# Patient Record
Sex: Female | Born: 2000 | Race: Black or African American | Hispanic: No | Marital: Single | Smoking: Current every day smoker
Health system: Southern US, Community
[De-identification: ages and names within clinical notes are randomized; demographics above are authoritative.]

## PROBLEM LIST (undated history)

## (undated) ENCOUNTER — Inpatient Hospital Stay: Payer: Self-pay

## (undated) DIAGNOSIS — F909 Attention-deficit hyperactivity disorder, unspecified type: Secondary | ICD-10-CM

## (undated) DIAGNOSIS — Z789 Other specified health status: Secondary | ICD-10-CM

## (undated) DIAGNOSIS — F913 Oppositional defiant disorder: Secondary | ICD-10-CM

---
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
## 2021-05-24 ENCOUNTER — Ambulatory Visit (LOCAL_COMMUNITY_HEALTH_CENTER): Payer: Medicaid Other | Admitting: Advanced Practice Midwife

## 2021-05-24 ENCOUNTER — Other Ambulatory Visit: Payer: Self-pay

## 2021-05-24 DIAGNOSIS — Z3009 Encounter for other general counseling and advice on contraception: Secondary | ICD-10-CM | POA: Diagnosis not present

## 2021-05-24 DIAGNOSIS — Z5321 Procedure and treatment not carried out due to patient leaving prior to being seen by health care provider: Secondary | ICD-10-CM

## 2021-05-24 NOTE — Progress Notes (Signed)
Client called to clinic and counseled would be approximately 30 - 40 minutes before provider exam and RN would review her history first. Client became tearful and asked to reschedule appt. Then asked if could just get STD check done. Counseled that provider would need to do STD exam. Client was friendly, but became more upset and tearful and called mother to come pick her up. Appt rescheduled for arrival time of 0800 on [name] schedule and aware will be first client seen. RN kept her completed hx and client aware will not need to repeat tomorrow am. Papers / education bag on [facility] nurse to do cart. Per client, had Nexplanon removed 6 months ago by [facility] and has been having unprotected sex since without pregnancy and wants to get that checked out. Counseled that [facility] did not do fertility evaluations and client understands. States has not had a physical in over 1 year so keeping am appt. [name], RN

## 2021-05-25 ENCOUNTER — Ambulatory Visit: Payer: Medicaid Other

## 2021-08-08 ENCOUNTER — Other Ambulatory Visit: Payer: Self-pay

## 2021-08-08 ENCOUNTER — Encounter: Payer: Self-pay | Admitting: Emergency Medicine

## 2021-08-08 ENCOUNTER — Emergency Department
Admission: EM | Admit: 2021-08-08 | Discharge: 2021-08-08 | Disposition: A | Discharge status: Home / Self Care | Payer: Medicaid Other | Attending: Emergency Medicine | Admitting: Emergency Medicine

## 2021-08-08 DIAGNOSIS — M545 Low back pain, unspecified: Secondary | ICD-10-CM | POA: Diagnosis not present

## 2021-08-08 DIAGNOSIS — M25551 Pain in right hip: Secondary | ICD-10-CM | POA: Insufficient documentation

## 2021-08-08 DIAGNOSIS — Z5321 Procedure and treatment not carried out due to patient leaving prior to being seen by health care provider: Secondary | ICD-10-CM | POA: Insufficient documentation

## 2021-08-08 NOTE — ED Notes (Signed)
Pt called in the [facility] with no response 

## 2021-08-08 NOTE — ED Notes (Signed)
Called x 2 no answer
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
## 2021-08-08 NOTE — ED Notes (Signed)
Called x 1 no answer

## 2021-10-19 ENCOUNTER — Ambulatory Visit: Payer: Medicaid Other | Admitting: Family Medicine

## 2021-10-19 ENCOUNTER — Other Ambulatory Visit: Payer: Self-pay

## 2021-10-19 ENCOUNTER — Encounter: Payer: Self-pay | Admitting: Family Medicine

## 2021-10-19 DIAGNOSIS — Z113 Encounter for screening for infections with a predominantly sexual mode of transmission: Secondary | ICD-10-CM | POA: Diagnosis not present

## 2021-10-19 DIAGNOSIS — Z202 Contact with and (suspected) exposure to infections with a predominantly sexual mode of transmission: Secondary | ICD-10-CM

## 2021-10-19 LAB — WET PREP FOR TRICH, YEAST, CLUE
Trichomonas Exam: NEGATIVE
Yeast Exam: NEGATIVE

## 2021-10-19 MED ORDER — DOXYCYCLINE HYCLATE 100 MG PO TABS: 100.0000 mg | ORAL_TABLET | Freq: Two times a day (BID) | ORAL | 0 refills | Status: AC

## 2021-10-19 NOTE — Progress Notes (Incomplete)
[facility] Department  STI clinic/screening visit [address] [place] [address] [phone]  Subjective:  Shelley Wagner is a 21 y.o. female being seen today for an STI screening visit. The patient reports they {Actions; do/do not:19616} have symptoms.  Patient reports that they {Actions; do/do not:19616} desire a pregnancy in the next year.   They reported they {Actions; are/are not:16769} interested in discussing contraception today.    Patient's last menstrual period was 10/06/2021 (approximate).   Patient has the following medical conditions:   Patient Active Problem List   Diagnosis Date Noted   Tubo-ovarian abscess 02/06/2018    Chief Complaint  Patient presents with   SEXUALLY TRANSMITTED DISEASE    screening    HPI  Patient reports  she is contact to NGU.  She states that only symptom noted is thickened white vaginal discharge with an odor.  Last HIV test per patient/review of record was 11/11/2020 Patient reports last pap was na.  Screening for MPX risk: Does the patient have an unexplained rash? {yes/no:20286} Is the patient MSM? {yes/no:20286} Does the patient endorse multiple sex partners or anonymous sex partners? {yes/no:20286} Did the patient have close or sexual contact with a person diagnosed with MPX? {yes/no:20286} Has the patient traveled outside the [place] where MPX is endemic? {yes/no:20286} Is there a high clinical suspicion for MPX-- evidenced by one of the following {yes/no:20286}  -Unlikely to be chickenpox  -Lymphadenopathy  -Rash that present in same phase of evolution on any given body part See flowsheet for further details and programmatic requirements.    The following portions of the patient's history were reviewed and updated as appropriate: allergies, current medications, past medical history, past social history, past surgical history and problem list.  Objective:  There were no vitals filed for this  visit.  Physical Exam   Assessment and Plan:  Shelley Wagner is a 21 y.o. female presenting to the [facility] Department for STI screening  1. Screening examination for venereal disease  - WET PREP FOR TRICH, YEAST, CLUE - HIV [place] [facility] - Syphilis Serology, [place] [facility] - Chlamydia/Gonorrhea [place] [facility]  2. Exposure to chlamydia  - doxycycline (VIBRA-TABS) 100 MG tablet; Take 1 tablet (100 mg total) by mouth 2 (two) times daily for 7 days.  Dispense: 14 tablet; Refill: 0     Return if symptoms worsen or fail to improve.  No future appointments.  Larene Pickett, FNP

## 2021-10-19 NOTE — Progress Notes (Signed)
Patient seen for STD visit. Medication dispensed per providers orders. Wet prep reviewed.  ?

## 2021-10-19 NOTE — Progress Notes (Signed)
[facility] Department ? ?STI clinic/screening visit ?[address] ?[place] [address] ?[phone] ? ?Subjective:  ?Shelley Wagner is a 21 y.o. female being seen today for an STI screening visit. The patient reports they do have symptoms.  Patient reports that they do desire a pregnancy in the next year.   They reported they are not interested in discussing contraception today.   ? ?Patient's last menstrual period was 10/06/2021 (approximate). ? ? ?Patient has the following medical conditions:   ?Patient Active Problem List  ? Diagnosis Date Noted  ? Tubo-ovarian abscess 02/06/2018  ? ? ?Chief Complaint  ?Patient presents with  ? SEXUALLY TRANSMITTED DISEASE  ?  screening  ? ? ?HPI ? ?Patient reports she is a contact to NGU.  She states that only symptom she has noted is a thickening of her white discharge and and odor.   ? ?Last HIV test per patient/review of record was 11/11/2020 ?Patient reports last pap was na.  ? ?Screening for MPX risk: ?Does the patient have an unexplained rash? No ?Is the patient MSM? No ?Does the patient endorse multiple sex partners or anonymous sex partners? No ?Did the patient have close or sexual contact with a person diagnosed with MPX? No ?Has the patient traveled outside the [place] where MPX is endemic? No ?Is there a high clinical suspicion for MPX-- evidenced by one of the following No ? -Unlikely to be chickenpox ? -Lymphadenopathy ? -Rash that present in same phase of evolution on any given body part ?See flowsheet for further details and programmatic requirements.  ? ? ?The following portions of the patient's history were reviewed and updated as appropriate: allergies, current medications, past medical history, past social history, past surgical history and problem list. ? ?Objective:  ?There were no vitals filed for this visit. ? ?Physical Exam ?Constitutional:   ?   Appearance: Normal appearance.  ?HENT:  ?   Head: Normocephalic and atraumatic.   ?Pulmonary:  ?   Effort: Pulmonary effort is normal.  ?Abdominal:  ?   Palpations: Abdomen is soft.  ?Genitourinary: ?   Comments: Client performed self-collect wet prep and NAAT. ?Musculoskeletal:     ?   General: Normal range of motion.  ?Lymphadenopathy:  ?   Head:  ?   Right side of head: No submandibular or posterior auricular adenopathy.  ?   Left side of head: No submandibular or posterior auricular adenopathy.  ?   Cervical:  ?   Right cervical: No posterior cervical adenopathy. ?   Upper Body:  ?   Right upper body: No axillary adenopathy.  ?   Left upper body: No axillary adenopathy.  ?Skin: ?   General: Skin is warm and dry.  ?Neurological:  ?   General: No focal deficit present.  ?   Mental Status: She is alert.  ?Psychiatric:     ?   Mood and Affect: Mood normal.     ?   Behavior: Behavior normal.  ? ? ? ?Assessment and Plan:  ?Shelley Wagner is a 21 y.o. female presenting to the [facility] Department for STI screening ? ?1. Screening examination for venereal disease ? ?- WET PREP FOR TRICH, YEAST, CLUE ?- HIV [place] [facility] ?- Syphilis Serology, [place] [facility] ?- Chlamydia/Gonorrhea [place] [facility] ? ?2. Exposure to chlamydia ? ?- doxycycline (VIBRA-TABS) 100 MG tablet; Take 1 tablet (100 mg total) by mouth 2 (two) times daily for 7 days.  Dispense:  14 tablet; Refill: 0 ?Co. To use condoms always with sexual activity and 2 weeks after treatment. ? ? ? ?Return if symptoms worsen or fail to improve. ? ?No future appointments. ? ?Larene Pickett, FNP ? ?

## 2021-10-23 LAB — GONOCOCCUS CULTURE

## 2021-10-28 ENCOUNTER — Telehealth: Payer: Self-pay

## 2021-10-28 NOTE — Telephone Encounter (Signed)
Calling pt regarding positive chlamydia result from 10/19/21 vaginal specimen. ? ?Phone call to pt at [phone]. Left message on voicemail that RN with [facility] is calling in regard to TR. Please call Cashel Bellina at [phone]. ? ?Also sent MyChart message to pt. ?

## 2021-10-29 NOTE — Telephone Encounter (Signed)
Phone call to pt at [phone]. Left voicemail message that RN with [facility] is calling re TR. Please call [name] back at [phone]. ?

## 2021-11-01 NOTE — Telephone Encounter (Signed)
Phone call to pt at [phone].  Voicemail full, unable to leave message. ? ? ?(Calling pt regarding positive chlamydia result.  Pt already treated with Doxycycline at 10/19/21 office visit due to contact to NGU.) ?

## 2021-11-01 NOTE — Telephone Encounter (Signed)
Received phone call back from pt. Pt confirmed password from last visit. Counseled pt regarding TR and confirmed pt received medication at last visit. Pt counseled to RTC for TOC in approx 3 months. ?

## 2022-02-08 ENCOUNTER — Emergency Department
Admission: EM | Admit: 2022-02-08 | Discharge: 2022-02-08 | Disposition: A | Discharge status: Home / Self Care | Payer: Medicaid Other | Attending: Emergency Medicine | Admitting: Emergency Medicine

## 2022-02-08 ENCOUNTER — Other Ambulatory Visit: Payer: Self-pay

## 2022-02-08 ENCOUNTER — Emergency Department: Payer: Medicaid Other

## 2022-02-08 ENCOUNTER — Ambulatory Visit: Payer: Medicaid Other

## 2022-02-08 ENCOUNTER — Ambulatory Visit: Payer: Medicaid Other | Admitting: Family Medicine

## 2022-02-08 DIAGNOSIS — B9689 Other specified bacterial agents as the cause of diseases classified elsewhere: Secondary | ICD-10-CM | POA: Insufficient documentation

## 2022-02-08 DIAGNOSIS — N760 Acute vaginitis: Secondary | ICD-10-CM | POA: Diagnosis not present

## 2022-02-08 DIAGNOSIS — A599 Trichomoniasis, unspecified: Secondary | ICD-10-CM | POA: Insufficient documentation

## 2022-02-08 DIAGNOSIS — R102 Pelvic and perineal pain: Secondary | ICD-10-CM | POA: Diagnosis present

## 2022-02-08 DIAGNOSIS — D72829 Elevated white blood cell count, unspecified: Secondary | ICD-10-CM | POA: Diagnosis not present

## 2022-02-08 DIAGNOSIS — A5424 Gonococcal female pelvic inflammatory disease: Secondary | ICD-10-CM | POA: Insufficient documentation

## 2022-02-08 DIAGNOSIS — N7093 Salpingitis and oophoritis, unspecified: Secondary | ICD-10-CM

## 2022-02-08 DIAGNOSIS — Z5321 Procedure and treatment not carried out due to patient leaving prior to being seen by health care provider: Secondary | ICD-10-CM

## 2022-02-08 LAB — CBC WITH DIFFERENTIAL/PLATELET
Abs Immature Granulocytes: 0.07 10*3/uL (ref 0.00–0.07)
Basophils Absolute: 0 10*3/uL (ref 0.0–0.1)
Basophils Relative: 0 %
Eosinophils Absolute: 0 10*3/uL (ref 0.0–0.5)
Eosinophils Relative: 0 %
HCT: 36.2 % (ref 36.0–46.0)
Hemoglobin: 11.3 g/dL — ABNORMAL LOW (ref 12.0–15.0)
Immature Granulocytes: 1 %
Lymphocytes Relative: 12 %
Lymphs Abs: 1.8 10*3/uL (ref 0.7–4.0)
MCH: 24.8 pg — ABNORMAL LOW (ref 26.0–34.0)
MCHC: 31.2 g/dL (ref 30.0–36.0)
MCV: 79.4 fL — ABNORMAL LOW (ref 80.0–100.0)
Monocytes Absolute: 1.1 10*3/uL — ABNORMAL HIGH (ref 0.1–1.0)
Monocytes Relative: 7 %
Neutro Abs: 12.3 10*3/uL — ABNORMAL HIGH (ref 1.7–7.7)
Neutrophils Relative %: 80 %
Platelets: 287 10*3/uL (ref 150–400)
RBC: 4.56 MIL/uL (ref 3.87–5.11)
RDW: 16.6 % — ABNORMAL HIGH (ref 11.5–15.5)
WBC: 15.2 10*3/uL — ABNORMAL HIGH (ref 4.0–10.5)
nRBC: 0 % (ref 0.0–0.2)

## 2022-02-08 LAB — COMPREHENSIVE METABOLIC PANEL
ALT: 13 U/L (ref 0–44)
AST: 18 U/L (ref 15–41)
Albumin: 3.9 g/dL (ref 3.5–5.0)
Alkaline Phosphatase: 95 U/L (ref 38–126)
Anion gap: 9 (ref 5–15)
BUN: 11 mg/dL (ref 6–20)
CO2: 23 mmol/L (ref 22–32)
Calcium: 8.8 mg/dL — ABNORMAL LOW (ref 8.9–10.3)
Chloride: 105 mmol/L (ref 98–111)
Creatinine, Ser: 0.71 mg/dL (ref 0.44–1.00)
GFR, Estimated: >60 mL/min (ref >60)
Glucose, Bld: 94 mg/dL (ref 70–99)
Potassium: 4.2 mmol/L (ref 3.5–5.1)
Sodium: 137 mmol/L (ref 135–145)
Total Bilirubin: 0.5 mg/dL (ref 0.3–1.2)
Total Protein: 7.7 g/dL (ref 6.5–8.1)

## 2022-02-08 LAB — URINALYSIS, ROUTINE W REFLEX MICROSCOPIC
Bilirubin Urine: NEGATIVE
Glucose, UA: NEGATIVE mg/dL
Hgb urine dipstick: NEGATIVE
Ketones, ur: NEGATIVE mg/dL
Nitrite: NEGATIVE
Protein, ur: NEGATIVE mg/dL
Specific Gravity, Urine: 1.025 (ref 1.005–1.030)
pH: 8 (ref 5.0–8.0)

## 2022-02-08 LAB — CHLAMYDIA/NGC RT PCR (ARMC ONLY)
Chlamydia Tr: NOT DETECTED
N gonorrhoeae: NOT DETECTED

## 2022-02-08 LAB — WET PREP, GENITAL
Sperm: NONE SEEN
WBC, Wet Prep HPF POC: <10 (ref <10)
Yeast Wet Prep HPF POC: NONE SEEN

## 2022-02-08 LAB — LIPASE, BLOOD: Lipase: 26 U/L (ref 11–51)

## 2022-02-08 LAB — POC URINE PREG, ED: Preg Test, Ur: NEGATIVE

## 2022-02-08 MED ORDER — DOXYCYCLINE HYCLATE 100 MG PO CAPS: 100.0000 mg | ORAL_CAPSULE | Freq: Two times a day (BID) | ORAL | 0 refills | Status: AC

## 2022-02-08 MED ORDER — ONDANSETRON HCL 4 MG/2ML IJ SOLN
4.0000 mg | Freq: Once | INTRAMUSCULAR | Status: AC
  Administered 2022-02-08: 4 mg via INTRAVENOUS
  Filled 2022-02-08: qty 2

## 2022-02-08 MED ORDER — METRONIDAZOLE 500 MG PO TABS: 500.0000 mg | ORAL_TABLET | Freq: Two times a day (BID) | ORAL | 0 refills | Status: AC

## 2022-02-08 MED ORDER — OXYCODONE-ACETAMINOPHEN 10-325 MG PO TABS: 1.0000 | ORAL_TABLET | Freq: Four times a day (QID) | ORAL | 0 refills | Status: AC | PRN

## 2022-02-08 MED ORDER — SODIUM CHLORIDE 0.9 % IV SOLN
2.0000 g | Freq: Once | INTRAVENOUS | Status: AC
  Administered 2022-02-08: 2 g via INTRAVENOUS
  Filled 2022-02-08: qty 20

## 2022-02-08 MED ORDER — FENTANYL CITRATE PF 50 MCG/ML IJ SOSY
50.0000 ug | PREFILLED_SYRINGE | Freq: Once | INTRAMUSCULAR | Status: AC
  Administered 2022-02-08: 50 ug via INTRAVENOUS
  Filled 2022-02-08: qty 1

## 2022-02-08 MED ORDER — FENTANYL CITRATE PF 50 MCG/ML IJ SOSY
100.0000 ug | PREFILLED_SYRINGE | Freq: Once | INTRAMUSCULAR | Status: AC
  Administered 2022-02-08: 100 ug via INTRAVENOUS
  Filled 2022-02-08: qty 2

## 2022-02-08 NOTE — ED Notes (Signed)
See triage note. Pt ambulatory to room. Pt c/o burning with urination., foul odor and pain. Pt with hx STDs

## 2022-02-10 ENCOUNTER — Encounter: Payer: Self-pay | Admitting: Obstetrics and Gynecology

## 2022-02-10 ENCOUNTER — Ambulatory Visit (INDEPENDENT_AMBULATORY_CARE_PROVIDER_SITE_OTHER): Payer: Medicaid Other | Admitting: Obstetrics and Gynecology

## 2022-02-10 VITALS — BP 90/60 | Ht 67.0 in | Wt 205.0 lb

## 2022-02-10 DIAGNOSIS — N7093 Salpingitis and oophoritis, unspecified: Secondary | ICD-10-CM

## 2022-02-10 DIAGNOSIS — A599 Trichomoniasis, unspecified: Secondary | ICD-10-CM

## 2022-02-10 DIAGNOSIS — B9689 Other specified bacterial agents as the cause of diseases classified elsewhere: Secondary | ICD-10-CM

## 2022-02-10 DIAGNOSIS — N730 Acute parametritis and pelvic cellulitis: Secondary | ICD-10-CM | POA: Insufficient documentation

## 2022-02-10 DIAGNOSIS — N760 Acute vaginitis: Secondary | ICD-10-CM

## 2022-02-10 NOTE — Progress Notes (Signed)
Pediatrics, [facility]   Chief Complaint  Patient presents with   Pelvic Pain    Entire area, urinary frequency and pain x 1 week    HPI:      [name] is a 21 y.o. G1P1001 whose LMP was Patient's last menstrual period was 02/02/2022 (exact date)., presents today for ER f/u from 02/08/22 for BV and trich with PID/pyosalpinx bilat on GYN u/s. Pt presented to ED with throbbing pelvic pain for a wk, hurts with movement. No n/v/d but has had chills. No vag sx. Has had urinary frequency with good flow, neg UA in ED. Had neg gon/chlam 02/08/22 but positive BV and trich. Treated with rocephin, flagyl BID for 2 wks and doxy BID for 2 wks. No change in pt's sx yet, as to be expected. Pt taking oxycodone Q6 hrs without sx relief.  She is sex active, partner getting treated for trich. Uses condoms, no other BC.   Menses usually monthly, lasting 5-7 days. No AUB.   02/08/22 GYN u/s results: Dilated tubular structures in the adnexa bilaterally, much better visualized on LEFT containing diffuse hypoechoic material, both likely representing pyosalpinx and PID.  Patient Active Problem List   Diagnosis Date Noted   PID (acute pelvic inflammatory disease) 02/10/2022   BV (bacterial vaginosis) 02/10/2022   Trichomoniasis 02/10/2022   Tubo-ovarian abscess 02/06/2018    History reviewed. No pertinent surgical history.  Family History  Problem Relation Age of Onset   COPD Maternal Grandmother    Heart disease Maternal Grandmother    Stroke Maternal Grandmother    Hypertension Maternal Grandmother     Social History   Socioeconomic History   Marital status: Single    Spouse name: Not on file   Number of children: Not on file   Years of education: Not on file   Highest education level: Not on file  Occupational History   Not on file  Tobacco Use   Smoking status: Every Day    Types: E-cigarettes   Smokeless tobacco: Never  Vaping Use   Vaping Use: Every day   Substances:  Nicotine, Flavoring  Substance and Sexual Activity   Alcohol use: No   Drug use: No   Sexual activity: Yes    Birth control/protection: None, Condom  Other Topics Concern   Not on file  Social History Narrative   Not on file   Social Determinants of Health   Financial Resource Strain: Not on file  Food Insecurity: Not on file  Transportation Needs: Not on file  Physical Activity: Not on file  Stress: Not on file  Social Connections: Not on file  Intimate Partner Violence: Not At Risk (10/19/2021)   Humiliation, Afraid, Rape, and Kick questionnaire    Fear of Current or Ex-Partner: No    Emotionally Abused: No    Physically Abused: No    Sexually Abused: No    Outpatient Medications Prior to Visit  Medication Sig Dispense Refill   doxycycline (VIBRAMYCIN) 100 MG capsule Take 1 capsule (100 mg total) by mouth 2 (two) times daily. 20 capsule 0   metroNIDAZOLE (FLAGYL) 500 MG tablet Take 1 tablet (500 mg total) by mouth 2 (two) times daily. 14 tablet 0   oxyCODONE-acetaminophen (PERCOCET) 10-325 MG tablet Take 1 tablet by mouth every 6 (six) hours as needed for pain. 15 tablet 0   No facility-administered medications prior to visit.      ROS:  Review of Systems  Constitutional:  Negative for  fever.  Gastrointestinal:  Negative for blood in stool, constipation, diarrhea, nausea and vomiting.  Genitourinary:  Positive for frequency and pelvic pain. Negative for dyspareunia, dysuria, flank pain, hematuria, urgency, vaginal bleeding, vaginal discharge and vaginal pain.  Musculoskeletal:  Negative for back pain.  Skin:  Negative for rash.  BREAST: No symptoms   OBJECTIVE:   Vitals:  BP 90/60   Ht 5\' 7"  (1.702 m)   Wt 205 lb (93 kg)   LMP 02/02/2022 (Exact Date)   Breastfeeding No   BMI 32.11 kg/m   Physical Exam Vitals reviewed.  Constitutional:      Appearance: She is well-developed.     Comments: LOOKS UNCOMFORTABLE DUE TO PAIN  Pulmonary:     Effort:  Pulmonary effort is normal.  Genitourinary:    General: Normal vulva.     Pubic Area: No rash.      Labia:        Right: No rash, tenderness or lesion.        Left: No rash, tenderness or lesion.      Vagina: No tenderness.     Cervix: Cervical motion tenderness present.     Uterus: Normal. Tender. Not enlarged.      Adnexa:        Right: Tenderness present. No mass.         Left: Tenderness present. No mass.    Musculoskeletal:        General: Normal range of motion.     Cervical back: Normal range of motion.  Skin:    General: Skin is warm and dry.  Neurological:     General: No focal deficit present.     Mental Status: She is alert and oriented to person, place, and time.  Psychiatric:        Mood and Affect: Mood normal.        Behavior: Behavior normal.        Thought Content: Thought content normal.        Judgment: Judgment normal.     Assessment/Plan: Pyosalpinx  PID (acute pelvic inflammatory disease)  BV (bacterial vaginosis)  Trichomoniasis  Pt very tender on exam as to be expected. Cont abx, RTO in 2 wks for f/u. NSAIDs 800 mg TID prn sx/heating pad. Not refilling oxycodone--pt aware takes time for pain to improve.  Partner doing trich tx, no sex for 7 days after both treated. Will need f/u GYN u/s for pyosalpinx--to be scheduled at f/u appt to give appropriate time for resolution.     Return in about 2 weeks (around 02/24/2022) for PID f/u (wiht anyone, i'm out of office).  Ramone Gander B. Maelin Kurkowski, PA-C 02/10/2022 4:17 PM

## 2022-02-25 ENCOUNTER — Ambulatory Visit: Payer: Medicaid Other | Admitting: Family Medicine

## 2022-04-04 ENCOUNTER — Other Ambulatory Visit: Payer: Self-pay

## 2022-04-04 ENCOUNTER — Emergency Department
Admission: EM | Admit: 2022-04-04 | Discharge: 2022-04-04 | Disposition: A | Discharge status: Home / Self Care | Payer: Medicaid Other | Attending: Emergency Medicine | Admitting: Emergency Medicine

## 2022-04-04 DIAGNOSIS — M7989 Other specified soft tissue disorders: Secondary | ICD-10-CM | POA: Diagnosis present

## 2022-04-04 DIAGNOSIS — L03011 Cellulitis of right finger: Secondary | ICD-10-CM | POA: Insufficient documentation

## 2022-04-04 MED ORDER — DOXYCYCLINE MONOHYDRATE 100 MG PO TABS: 100.0000 mg | ORAL_TABLET | Freq: Two times a day (BID) | ORAL | 0 refills | Status: AC

## 2022-04-04 MED ORDER — AMOXICILLIN-POT CLAVULANATE 875-125 MG PO TABS
1.0000 | ORAL_TABLET | Freq: Two times a day (BID) | ORAL | 0 refills | Status: AC
Start: 2022-04-04 — End: 2022-04-09

## 2022-04-04 MED ORDER — AMOXICILLIN-POT CLAVULANATE 875-125 MG PO TABS
1.0000 | ORAL_TABLET | Freq: Once | ORAL | Status: AC
  Administered 2022-04-04: 1 via ORAL
  Filled 2022-04-04: qty 1

## 2022-04-04 MED ORDER — IBUPROFEN 400 MG PO TABS
400.0000 mg | ORAL_TABLET | Freq: Once | ORAL | Status: AC
  Administered 2022-04-04: 400 mg via ORAL
  Filled 2022-04-04: qty 1

## 2022-04-04 MED ORDER — DOXYCYCLINE HYCLATE 100 MG PO TABS
100.0000 mg | ORAL_TABLET | Freq: Once | ORAL | Status: AC
  Administered 2022-04-04: 100 mg via ORAL
  Filled 2022-04-04: qty 1

## 2022-04-04 NOTE — ED Triage Notes (Signed)
To triage with c/o finger pain and swelling to right middle finger.  Pt thought it may have been a hangnail or ingrown nail at first and then thought she bit her nail down too far.  Edema noted to finger, denies injury.

## 2022-04-04 NOTE — ED Provider Notes (Signed)
[facility] Provider Note    Event Date/Time   First MD Initiated Contact with Patient 04/04/22 [phone]     (approximate)   History   No chief complaint on file.   HPI  Shelley Wagner is a 21 y.o. female with past medical history of ADHD who presents with pain and swelling of the right middle finger.  Patient noticed this over the last 3 to 4 days.  She does bite her fingers.  No spontaneous drainage.  Denies history of similar.  No trauma.    Past Medical History:  Diagnosis Date   ADHD (attention deficit hyperactivity disorder)    Medical history non-contributory    Oppositional defiant disorder     Patient Active Problem List   Diagnosis Date Noted   PID (acute pelvic inflammatory disease) 02/10/2022   BV (bacterial vaginosis) 02/10/2022   Trichomoniasis 02/10/2022   Tubo-ovarian abscess 02/06/2018     Physical Exam  Triage Vital Signs: ED Triage Vitals  Enc Vitals Group     BP 04/04/22 0007 (!) 121/97     Pulse Rate 04/04/22 0007 100     Resp 04/04/22 0007 20     Temp 04/04/22 0007 97.9 F (36.6 C)     Temp Source 04/04/22 0007 Oral     SpO2 04/04/22 0007 97 %     Weight 04/04/22 0007 200 lb (90.7 kg)     Height 04/04/22 0007 5\' 6"  (1.676 m)     Head Circumference --      Peak Flow --      Pain Score 04/04/22 0012 10     Pain Loc --      Pain Edu? --      Excl. in GC? --     Most recent vital signs: Vitals:   04/04/22 0007  BP: (!) 121/97  Pulse: 100  Resp: 20  Temp: 97.9 F (36.6 C)  SpO2: 97%     General: Awake, no distress.  CV:  Good peripheral perfusion.  Resp:  Normal effort.  Abd:  No distention.  Neuro:             Awake, Alert, Oriented x 3  Other:  Right middle finger with significant swelling around the entirety of the nail fold there is greenish discoloration along the lateral border of the nail fold no tenderness or swelling of the finger pulp   ED Results / Procedures / Treatments  Labs (all labs  ordered are listed, but only abnormal results are displayed) Labs Reviewed - No data to display   EKG     RADIOLOGY    PROCEDURES:  Critical Care performed: No  Procedures   MEDICATIONS ORDERED IN ED: Medications  ibuprofen (ADVIL) tablet 400 mg (has no administration in time range)  doxycycline (VIBRA-TABS) tablet 100 mg (has no administration in time range)  amoxicillin-clavulanate (AUGMENTIN) 875-125 MG per tablet 1 tablet (has no administration in time range)     IMPRESSION / MDM / ASSESSMENT AND PLAN / ED COURSE  I reviewed the triage vital signs and the nursing notes.                              Patient's presentation is most consistent with acute, uncomplicated illness.  Differential diagnosis includes, but is not limited to, paronychia with abscess, osteomyelitis, felon  Patient is a 21 year old female presents with swelling pain of the right distal middle finger.  On exam she has clear paronychia that is fairly extensive.  There is swelling along the entirety of the nail fold greenish discoloration on the lateral part.  There is no evidence of involvement of the palmar side of the finger including the pulp to suggest a felon.  Drainage procedure was performed after patient soak the finger in warm water I elevated the lateral nail fold or and there was immediate drainage of copious purulence.  Prescribe doxycycline for MRSA coverage and Augmentin given exposure to oral flora as patient bites her nails per up-to-date guidelines.  Recommended soaking the finger 3-4 times daily and if symptoms worsening or not improving despite treatment return to ED.       FINAL CLINICAL IMPRESSION(S) / ED DIAGNOSES   Final diagnoses:  Paronychia of right middle finger     Rx / DC Orders   ED Discharge Orders          Ordered    doxycycline (ADOXA) 100 MG tablet  2 times daily        04/04/22 0358    amoxicillin-clavulanate (AUGMENTIN) 875-125 MG tablet  2 times daily         04/04/22 0358             Note:  This document was prepared using Dragon voice recognition software and may include unintentional dictation errors.   Georga Hacking, MD 04/04/22 [phone]

## 2022-04-04 NOTE — Discharge Instructions (Addendum)
You have a paronychia which is infection of the nail fold.  Please take the 2 antibiotics twice a day for the next 5 days.  Please soak the finger at least 3-4 times per day in warm water to promote healing.  If your swelling or pain is worsening despite soaks and antibiotics please return to the emergency department for further evaluation.

## 2022-05-20 ENCOUNTER — Ambulatory Visit: Payer: Medicaid Other

## 2022-07-27 ENCOUNTER — Ambulatory Visit: Payer: Medicaid Other

## 2023-08-01 ENCOUNTER — Ambulatory Visit: Payer: Self-pay
